# Patient Record
Sex: Male | Born: 1995
Health system: Southern US, Community
[De-identification: ages and names within clinical notes are randomized; demographics above are authoritative.]

## PROBLEM LIST (undated history)

## (undated) DIAGNOSIS — G43909 Migraine, unspecified, not intractable, without status migrainosus: Secondary | ICD-10-CM

## (undated) DIAGNOSIS — J302 Other seasonal allergic rhinitis: Secondary | ICD-10-CM

---
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
## 2017-01-27 IMAGING — DX DG ABDOMEN ACUTE W/ 1V CHEST
3 series · 3 of 3 positions shown · non-contrast
Comparison: 10/26/2013

CLINICAL DATA: Sore throat, right lower quadrant abdominal pain x3
days

EXAM:
DG ABDOMEN ACUTE W/ 1V CHEST

[chest pa]
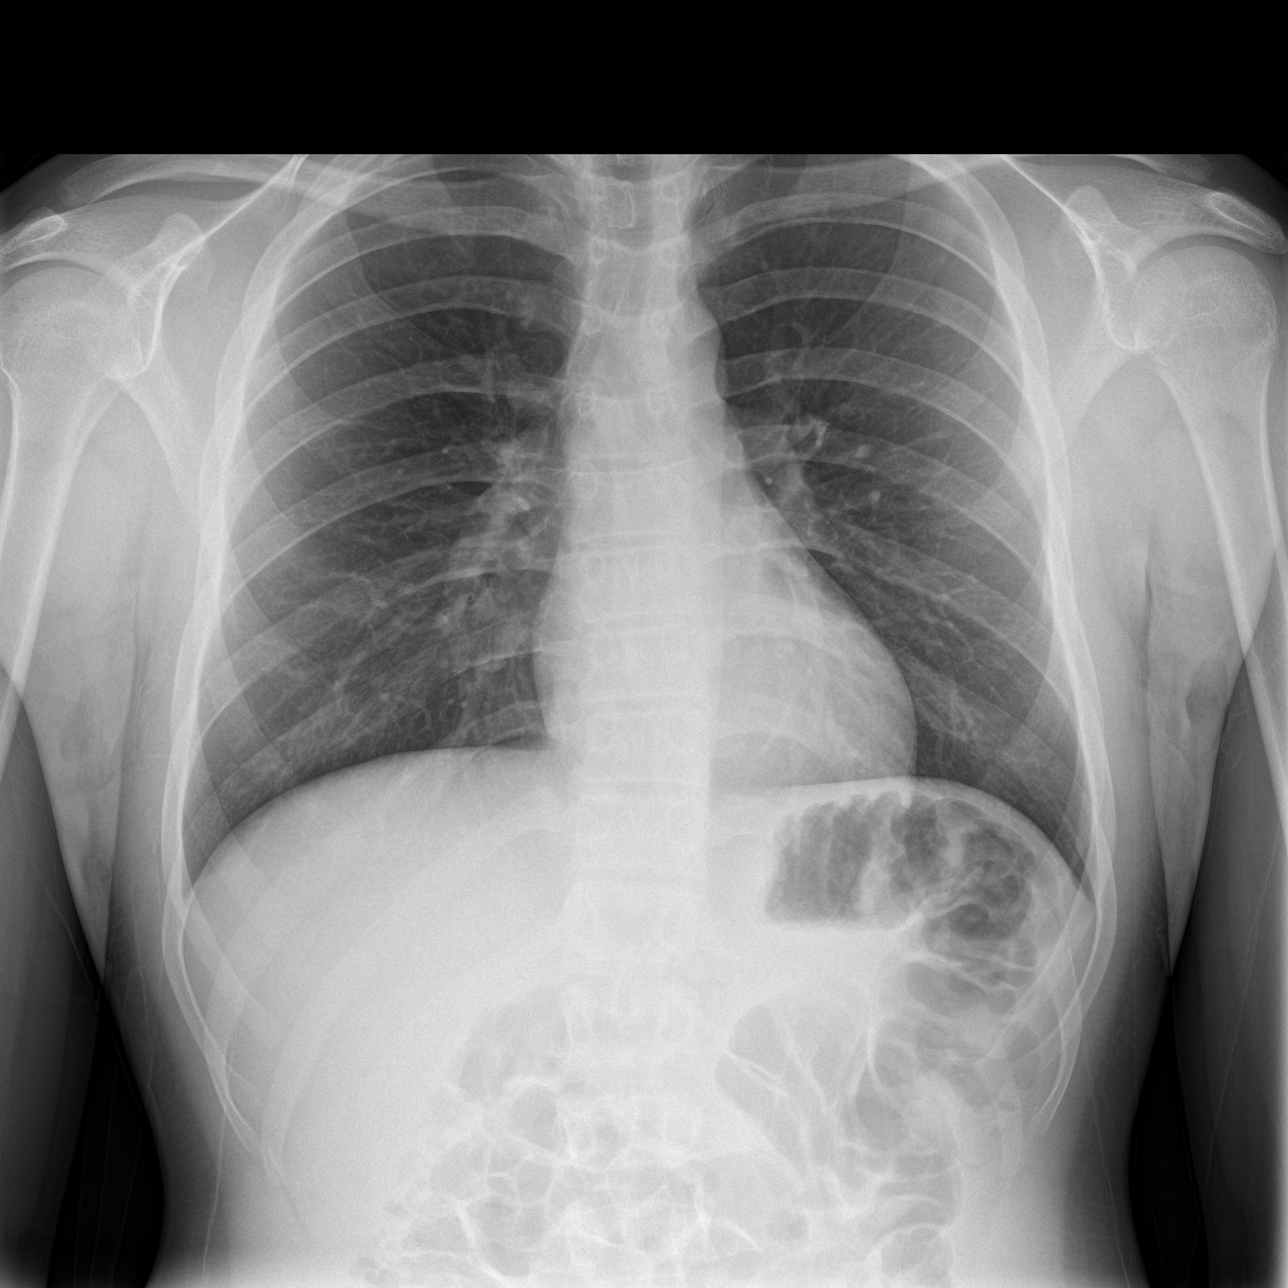

[abdomen erect]
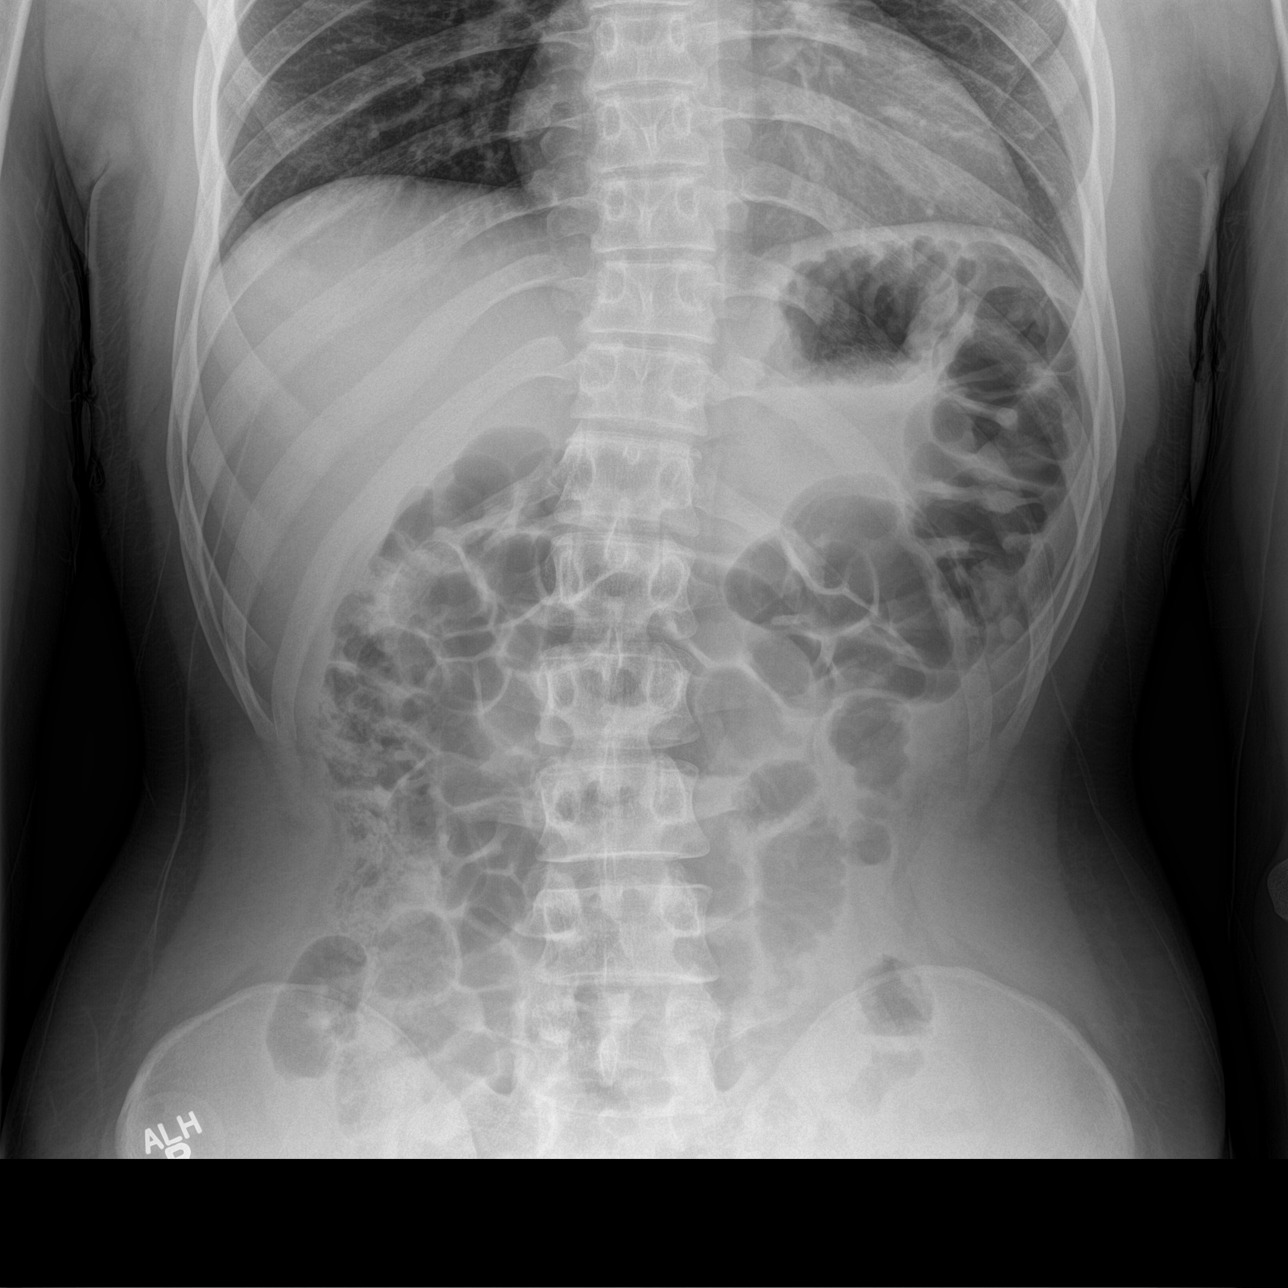

[abdomen supine]
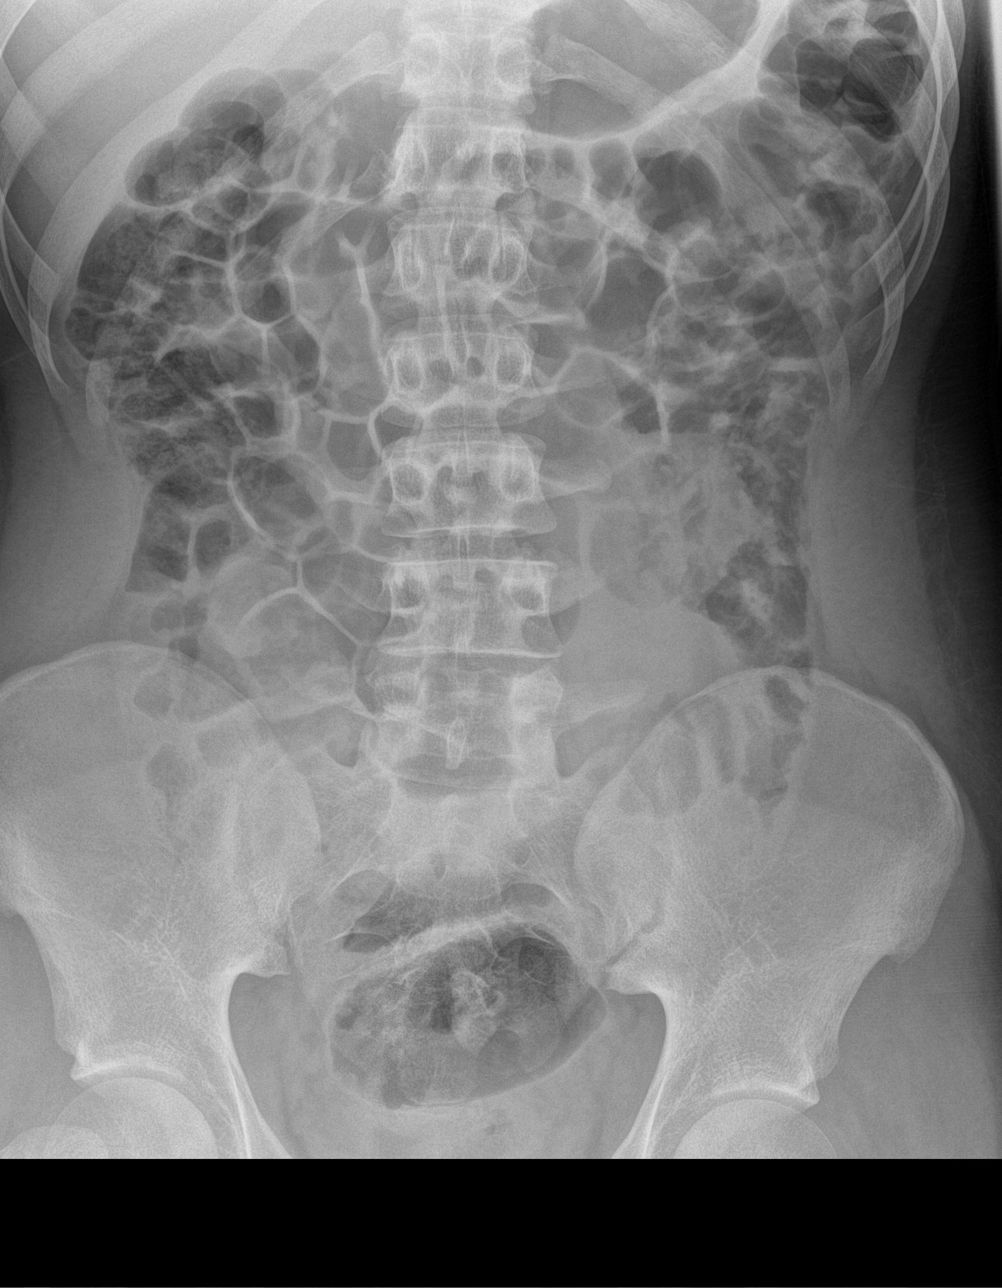

[3 of 3 positions shown; findings below may reference images not displayed]

FINDINGS: Lungs are clear.  No pleural effusion or pneumothorax.

The heart is normal in size.

Nonobstructive bowel gas pattern. No evidence of free air under the
diaphragm on the upright view.

Visualized osseous structures are within normal limits.
IMPRESSION: No evidence of acute cardiopulmonary disease.

No evidence of small bowel obstruction or free air.

## 2017-08-21 DIAGNOSIS — B9689 Other specified bacterial agents as the cause of diseases classified elsewhere: Secondary | ICD-10-CM | POA: Diagnosis not present

## 2017-08-21 DIAGNOSIS — J019 Acute sinusitis, unspecified: Secondary | ICD-10-CM | POA: Diagnosis not present

## 2017-08-21 DIAGNOSIS — J22 Unspecified acute lower respiratory infection: Secondary | ICD-10-CM | POA: Diagnosis not present

## 2017-08-21 DIAGNOSIS — R509 Fever, unspecified: Secondary | ICD-10-CM | POA: Diagnosis not present

## 2017-11-24 ENCOUNTER — Ambulatory Visit (INDEPENDENT_AMBULATORY_CARE_PROVIDER_SITE_OTHER): Payer: Worker's Compensation | Admitting: Orthopaedic Surgery

## 2017-11-24 ENCOUNTER — Ambulatory Visit (INDEPENDENT_AMBULATORY_CARE_PROVIDER_SITE_OTHER): Payer: Worker's Compensation

## 2017-11-24 DIAGNOSIS — M79645 Pain in left finger(s): Secondary | ICD-10-CM | POA: Diagnosis not present

## 2017-11-24 MED ORDER — "XEROFORM PETROLAT GAUZE 1""X8"" EX MISC": CUTANEOUS | 3 refills | Status: AC

## 2017-11-24 NOTE — Progress Notes (Signed)
Office Visit Note   Patient: Bill Woodward           Date of Birth: July 12, 1995           MRN: [id number] Visit Date: 11/24/2017              Requested by: Bernadette Hoit, MD [employer] [address], [address] [place], [place] [address] PCP: Bernadette Hoit, MD   Assessment & Plan: Visit Diagnoses:  1. Pain of left thumb     Plan: Impression is left thumb injury.  Continue Xeroform dressing changes to the nailbed.  Continue with Keflex until finished.  Recheck in 2 weeks.  In terms of the nondisplaced fracture this should heal just fine with conservative treatment.  Discontinue AlumaFoam splint at this point and begin joint mobilization  Follow-Up Instructions: Return in about 2 weeks (around 12/08/2017).   Orders:  Orders Placed This Encounter  Procedures  . XR Finger Thumb Left   Meds ordered this encounter  Medications  . Bismuth Tribromoph-Petrolatum (XEROFORM PETROLAT GAUZE 1"X8") MISC    Sig: Apply xeroform to finger BID    Dispense:  10 each    Refill:  3      Procedures: No procedures performed   Clinical Data: No additional findings.   Subjective: Chief Complaint  Patient presents with  . Left Thumb - Injury, Pain    DOI 11/13/17 - slammed left thumb in a door     Bill Woodward is a 22 year old who sustained a thumb injury to his nail and nailbed on 11/13/2017 in which his thumb was slammed in a door at work.  He had no nailbed injury but the nail was injured and was removed at the urgent care.  He is currently taking oral antibiotics.  He is doing local wound changes with Xeroform.  He comes in today for follow-up.   Review of Systems  Constitutional: Negative.   All other systems reviewed and are negative.    Objective: Vital Signs: There were no vitals taken for this visit.  Physical Exam  Constitutional: He is oriented to person, place, and time. He appears well-developed and well-nourished.  HENT:  Head: Normocephalic  and atraumatic.  Eyes: Pupils are equal, round, and reactive to light.  Neck: Neck supple.  Pulmonary/Chest: Effort normal.  Abdominal: Soft.  Musculoskeletal: Normal range of motion.  Neurological: He is alert and oriented to person, place, and time.  Skin: Skin is warm.  Psychiatric: He has a normal mood and affect. His behavior is normal. Judgment and thought content normal.  Nursing note and vitals reviewed.   Ortho Exam  Left thumb exam demonstrates hemostatic scab.  No neurovascular compromise.  Flexor extensor function intact.  The nailbed is intact. Specialty Comments:  No specialty comments available.  Imaging: Xr Finger Thumb Left  Result Date: 11/24/2017 Small nondisplaced fracture of distal phalanx.    PMFS History: Patient Active Problem List   Diagnosis Date Noted  . Migraine variant 10/18/2013  . Migraine without aura 10/18/2013   Past Medical History:  Diagnosis Date  . Asthma   . Migraine   . Seasonal allergies     Family History  Problem Relation Age of Onset  . Migraines Mother   . COPD Paternal Grandmother        Died at 62  . Anuerysm Paternal Grandfather        Died at 31 brain anuerysm  . Allergies Father     No past surgical  history on file. Social History   Occupational History  . Not on file  Tobacco Use  . Smoking status: Never Smoker  . Smokeless tobacco: Never Used  Substance and Sexual Activity  . Alcohol use: No  . Drug use: No  . Sexual activity: Never

## 2017-11-30 ENCOUNTER — Telehealth (INDEPENDENT_AMBULATORY_CARE_PROVIDER_SITE_OTHER): Payer: Self-pay

## 2017-11-30 NOTE — Telephone Encounter (Signed)
[place] the 11/24/17 office note stating this was a work comp case. DOI 11/14/17. Faxed the note and put on cover to please fax the work comp billing info to my attn

## 2017-12-08 ENCOUNTER — Ambulatory Visit (INDEPENDENT_AMBULATORY_CARE_PROVIDER_SITE_OTHER): Payer: Worker's Compensation | Admitting: Orthopaedic Surgery

## 2017-12-08 ENCOUNTER — Encounter (INDEPENDENT_AMBULATORY_CARE_PROVIDER_SITE_OTHER): Payer: Self-pay | Admitting: Orthopaedic Surgery

## 2017-12-08 DIAGNOSIS — M79645 Pain in left finger(s): Secondary | ICD-10-CM

## 2017-12-08 NOTE — Progress Notes (Signed)
      Patient: Bill Woodward           Date of Birth: 1995/12/29           MRN: [id number] Visit Date: 12/08/2017 PCP: Bernadette HoitPuzio, Lawrence, MD   Assessment & Plan:  Chief Complaint:  Chief Complaint  Patient presents with  . Left Thumb - Pain   Visit Diagnoses:  1. Pain of left thumb     Plan: Patient is a pleasant 22 year old gentleman who presents to our clinic today for follow-up of his left thumb injury.  He he sustained this injury when getting his thumb slammed in a door at work.  Initially, he was seen at an urgent care setting where his nail was removed.  X-ray showed a small nondisplaced distal phalanx fracture.  He was on oral antibiotics but has finished these.  He has been doing dressing changes with Xeroform and Kerlix.  Today, he states he is doing much better.  Minimal to no pain.  No drainage to the thumb.  Examination of the left thumb shows a hemostatic scab.  No signs of infection or cellulitis.  Ok ROM at the thumb.  At this point, we will have the patient start range of motion exercises.  He will follow-up with us in 4 weeks for recheck.  Continue current work restrictions for 4 weeks.  Call with concerns or questions in the meantime.  Follow-Up Instructions: Return in about 1 month (around 01/05/2018).   Orders:  No orders of the defined types were placed in this encounter.  No orders of the defined types were placed in this encounter.   Imaging: No results found.  PMFS History: Patient Active Problem List   Diagnosis Date Noted  . Pain of left thumb 12/08/2017  . Migraine variant 10/18/2013  . Migraine without Bill 10/18/2013   Past Medical History:  Diagnosis Date  . Asthma   . Migraine   . Seasonal allergies     Family History  Problem Relation Age of Onset  . Migraines Mother   . COPD Paternal Grandmother        Died at 3178  . Anuerysm Paternal Grandfather        Died at 5177 brain anuerysm  . Allergies Father     History reviewed. No  pertinent surgical history. Social History   Occupational History  . Not on file  Tobacco Use  . Smoking status: Never Smoker  . Smokeless tobacco: Never Used  Substance and Sexual Activity  . Alcohol use: No  . Drug use: No  . Sexual activity: Never

## 2018-01-11 ENCOUNTER — Encounter (INDEPENDENT_AMBULATORY_CARE_PROVIDER_SITE_OTHER): Payer: Self-pay | Admitting: Orthopaedic Surgery

## 2018-01-11 ENCOUNTER — Ambulatory Visit (INDEPENDENT_AMBULATORY_CARE_PROVIDER_SITE_OTHER): Payer: Worker's Compensation | Admitting: Orthopaedic Surgery

## 2018-01-11 VITALS — Ht 69.0 in | Wt 163.0 lb

## 2018-01-11 DIAGNOSIS — M79645 Pain in left finger(s): Secondary | ICD-10-CM | POA: Diagnosis not present

## 2018-01-11 NOTE — Progress Notes (Signed)
      Patient: Bill Woodward           Date of Birth: [birth year]           MRN: [id number] Visit Date: 01/11/2018 PCP: Bernadette HoitPuzio, [place], MD   Assessment & Plan:  Chief Complaint:  Chief Complaint  Patient presents with  . Left Hand - Follow-up    Thumb pain   Visit Diagnoses:  1. Thumb pain, left     Plan: Patient is a pleasant 22 year old who comes in today for follow-up of his left thumb.  He is about 8 weeks out from a crush injury while at work where he sustained a nondisplaced distal phalanx fracture  as well as a wound to the nailbed.  Over the past 4 weeks, he has been working on range of motion exercises.  He has regained near full range of motion but still lacks strength.  He has minimal pain.  He has been working under light duty restrictions.  Examination of the left thumb shows no evidence of cellulitis or infection.  His nail has fallen off completely.  Full range of motion.  Decreased strength with thumb flexion and extension.  At this point, we will send the patient to formal therapy for 1 session for them to lay out strengthening exercises.  We will continue his current work restrictions for another 4 weeks.  He will follow-up with us at that point for recheck.  Call with concerns or questions in the meantime.  Follow-Up Instructions: Return in about 1 month (around 02/08/2018).   Orders:  No orders of the defined types were placed in this encounter.  No orders of the defined types were placed in this encounter.   Imaging: No results found.  PMFS History: Patient Active Problem List   Diagnosis Date Noted  . Thumb pain, left 12/08/2017  . Migraine variant 10/18/2013  . Migraine without Bill 10/18/2013   Past Medical History:  Diagnosis Date  . Asthma   . Migraine   . Seasonal allergies     Family History  Problem Relation Age of Onset  . Migraines Mother   . COPD Paternal Grandmother        Died at 2278  . Anuerysm Paternal Grandfather        Died at  6077 brain anuerysm  . Allergies Father     History reviewed. No pertinent surgical history. Social History   Occupational History  . Not on file  Tobacco Use  . Smoking status: Never Smoker  . Smokeless tobacco: Never Used  Substance and Sexual Activity  . Alcohol use: No  . Drug use: No  . Sexual activity: Never
